# Patient Record
Sex: Female | Born: 1940 | Race: White | Hispanic: No | Marital: Married
Health system: Southern US, Community
[De-identification: ages and names within clinical notes are randomized; demographics above are authoritative.]

---

## 2003-03-27 ENCOUNTER — Other Ambulatory Visit: Admission: RE | Admit: 2003-03-27 | Discharge: 2003-03-27 | Payer: Self-pay | Admitting: Family Medicine

## 2003-04-03 ENCOUNTER — Emergency Department (HOSPITAL_COMMUNITY): Admission: EM | Admit: 2003-04-03 | Discharge: 2003-04-03 | Payer: Self-pay | Admitting: Emergency Medicine

## 2003-05-01 ENCOUNTER — Encounter (INDEPENDENT_AMBULATORY_CARE_PROVIDER_SITE_OTHER): Payer: Self-pay | Admitting: *Deleted

## 2003-05-01 ENCOUNTER — Ambulatory Visit (HOSPITAL_COMMUNITY): Admission: RE | Admit: 2003-05-01 | Discharge: 2003-05-02 | Payer: Self-pay | Admitting: General Surgery

## 2004-04-22 ENCOUNTER — Other Ambulatory Visit: Admission: RE | Admit: 2004-04-22 | Discharge: 2004-04-22 | Payer: Self-pay | Admitting: Family Medicine

## 2005-05-14 ENCOUNTER — Other Ambulatory Visit: Admission: RE | Admit: 2005-05-14 | Discharge: 2005-05-14 | Payer: Self-pay | Admitting: Family Medicine

## 2007-05-29 ENCOUNTER — Other Ambulatory Visit: Admission: RE | Admit: 2007-05-29 | Discharge: 2007-05-29 | Payer: Self-pay | Admitting: Family Medicine

## 2007-08-02 ENCOUNTER — Encounter (INDEPENDENT_AMBULATORY_CARE_PROVIDER_SITE_OTHER): Payer: Self-pay | Admitting: Obstetrics and Gynecology

## 2007-08-02 ENCOUNTER — Ambulatory Visit (HOSPITAL_COMMUNITY): Admission: RE | Admit: 2007-08-02 | Discharge: 2007-08-03 | Payer: Self-pay | Admitting: Obstetrics and Gynecology

## 2010-06-02 NOTE — Op Note (Signed)
NAMECHERRYL, Julia [name]               ACCOUNT NO.:  [id number]   MEDICAL RECORD NO.:  [id number]          PATIENT TYPE:  OIB   LOCATION:  9304                          FACILITY:  [facility]   PHYSICIAN:  Charles A. Delcambre, MDDATE OF BIRTH:  1940-10-11   DATE OF PROCEDURE:  08/02/2007  DATE OF DISCHARGE:                               OPERATIVE REPORT   PREOPERATIVE DIAGNOSES:  1. Uterine prolapse.  2. Cystocele.   POSTOPERATIVE DIAGNOSES:  1. Uterine prolapse.  2. Cystocele.   PROCEDURE:  1. Transvaginal hysterectomy.  2. Bilateral salpingo-oophorectomy.  3. Anterior repair.   SURGEON:  Charles A. Sydnee Cabal, MD   ASSISTANT:  Gerald Leitz, MD   COMPLICATIONS:  None.   ESTIMATED BLOOD LOSS:  100 mL.   ANESTHESIA:  General via the endotracheal route.   SPECIMEN:  Uterus, tubes, and ovaries bilaterally to pathology.   FINDINGS:  Large cystocele and prolapse of the uterus to 1 cm out of the  introitus.   INSTRUMENT, SPONGE, AND NEEDLE COUNTS:  Correct x2.   DESCRIPTION OF PROCEDURE:  The patient was taken to the operating room  and placed in supine position.  General anesthetic was induced without  difficulty.  She was then placed in dorsal lithotomy position with  Universal stirrups.  Sterile prep and drape was undertaken.  Speculum  was placed in the vagina.  Lahey clamps were used to grasp the speculum  via the cervix.  A 1% lidocaine with 1:100,000 epinephrine was injected  circumferentially, 10 mL, to help develop the dissection planes.  Knife  was then used to score around the cervix.  Bladder pillars  were cut and  dissection was done sharply, part of the way up the lower uterine  segment to start the bladder flap plane.  Ray-Tec was then packed into  the space and spontaneous entry into the peritoneal cavity was noted  upon withdrawal of the Ray-Tec sponge.  Posterior colpotomy was done  without incident.  Bonnano retractor was placed.  Uterosacral ligaments  were then  taken, cut, and transfixed with 0-Vicryl, and held.  Second  pedicles were taken up on either side and transfixion stitched.  Third  pedicles did include the uterine vessels.  These were carefully tied  with good hemostasis resulting.  Two other pedicles were taken up either  side and transfixion stitched.  The uterus was then flipped posteriorly  and the utero-ovarian pedicles were cross-clamped and cut.  Uterus and  cervix was removed.  Free ties were placed on the utero-ovarian pedicles  bilaterally with a transfixion stitch of 0-Vicryl.  Hemostasis was good  on the left.  There was some bleeding on the right.  Getting better  exposure, ovary was grasped with Babcock forceps.  Tube was retracted in  and bleeding could be seen coming from just beneath the ovary.  This  area was cross-clamped and the ovary was excised with a fallopian tube.  Free tie and then transfixion stitch of 0-Vicryl was placed.  It was  good.  One area of the peritoneal bleeding was held with right angle 2-0  Vicryl and  a transfixion stitch around this area.  Good hemostasis  resulted.  The peritoneum was isolated and a 2-0 Vicryl purse-string  stitch was placed.  She was then noted to have good pedicles.  Sutures  were cut and the cuff was run with 0-Vicryl for hemostasis while the  anterior repair was done.  Allis clamps were placed.  Metzenbaum  scissors were used to develop the plane dropping the bladder off the  anterior vaginal mucosa.  These were successfully held with Allis clamps  and dissection was taken up to about 1-1.5 cm from the urethral opening  encompassing the cystocele completely.  Some sharp dissection with the  Metzenbaum scissors and blunt dissection was used to take the bladder  down off the bladder flaps on either side.  Endopelvic fascia was then  plicated in a total of 4 sutures of 2-0 Vicryl giving excellent support  to the bladder.  Hemostasis was excellent.  Excessive vaginal mucosa  was  then excised and the cuff Richardson angle sutures were placed with 0-  Vicryl.  Cuff was then run and locked along with the vaginal mucosa over  the cystocele with 2-0 Vicryl over the cystocele dissection and 0-Vicryl  over the cuff.  Hemostasis was excellent.  Foley catheter was placed in  the bladder and the Estrace 1 x 1-inch pack was then placed in the  vagina.  The patient was taken to recovery with physician in attendance  having tolerated the procedure well.      Charles A. Sydnee Cabal, MD  Electronically Signed     CAD/MEDQ  D:  08/02/2007  T:  08/02/2007  Job:  [id number]

## 2010-06-05 NOTE — Op Note (Signed)
NAME:  Julia [name], Julia [name]                         ACCOUNT NO.:  [id number]   MEDICAL RECORD NO.:  [id number]                   PATIENT TYPE:  OIB   LOCATION:  5725                                 FACILITY:  [facility]   PHYSICIAN:  Ollen Gross. Vernell Morgans, M.D.              DATE OF BIRTH:  05-27-1940   DATE OF PROCEDURE:  05/01/2003  DATE OF DISCHARGE:  05/02/2003                                 OPERATIVE REPORT   PREOPERATIVE DIAGNOSES:  Gallstones.   POSTOPERATIVE DIAGNOSES:  Gallstones.   PROCEDURE:  Laparoscopic cholecystectomy with intraoperative cholangiogram.   SURGEON:  Ollen Gross. Carolynne Edouard, M.D.   ASSISTANT:  Currie Paris, M.D.   ANESTHESIA:  General endotracheal.   DESCRIPTION OF PROCEDURE:  After informed consent was obtained, the patient  was brought to the operating room, placed in supine position on the  operating table.  After adequate induction of general endotracheal  anesthesia, the patient's abdomen was prepped with Betadine and draped in  the usual sterile manner.  The area below the umbilicus was infiltrated with  0.25% Marcaine, a small incision was made with a 15 blade knife, this  incision was carried down through the subcutaneous tissue bluntly with a  Kelly clamp and [employer] retractors until the linea alba was identified.  The linea alba was incised with a 15 blade knife and each side was grasped  with Kocher clamps and elevated anteriorly.  The preperitoneal space was  probed bluntly with a hemostat until the peritoneum was opened and access  was gained to the abdominal cavity. A #0 Vicryl pursestring suture was  placed in the fascia surrounding the opening, Hasson cannula was placed  through the opening and anchored in place with the previously placed Vicryl  pursestring stitch. The abdomen was then insufflated with carbon dioxide  without difficulty. The patient was placed in the headup position, the  laparoscope was placed through the Hasson cannula, the right  upper quadrant  was inspected. The dome of the gallbladder and liver were readily  identified. The epigastric region was then infiltrated with 0.25% Marcaine.  A small incision was made with a 15 blade knife and a 10 mm port was placed  bluntly through this incision into the abdominal cavity under direct vision.  Sites were then chosen for the 5 mm ports on the right side of the abdomen  laterally.  These areas were infiltrated with 0.25% Marcaine, small stab  incisions were made with a 15 blade knife and 5 mm ports were placed bluntly  through these incisions into the abdominal cavity under direct vision.  A  blunt grasper was placed through the lateral most 5 mm port and used to  grasp the dome of the gallbladder and elevate it anteriorly and superiorly.  Another blunt grasper was placed through the other 5 mm port and used to  retract on the body and neck of the gallbladder. A dissector  was placed  through the epigastric port and using the electrocautery the peritoneal  reflection at the gallbladder neck was opened. Blunt dissection was then  carried out in this area until the gallbladder neck cystic duct junction was  readily identified and a good window was created. A single clip was placed  on the gallbladder neck, a small ductotomy was made just below the clip. A  14 gauge angiocath was then placed percutaneously through the anterior  abdominal wall under direct vision. A Reddick cholangiogram catheter was  then placed through the angiocath and flushed. The Reddick catheter was  placed within the cystic duct and anchored in place with a clip.  A  cholangiogram was obtained that showed no filling defects, good emptying  into the duodenum and adequate length on the cystic duct. The anchoring clip  and catheter was then removed from the patient and three clips were placed  proximally on the cystic duct and the duct was divided between the two sets  of clips. Posterior to this, the  cystic artery was identified and gain  dissected bluntly in a circumferential manner until a good window was  created. Two clips were placed proximally and one distally on the artery and  the artery was divided between the two. Next, the laparoscopic hook cautery  device was used to separate the gallbladder from the liver bed prior to  completely detaching the gallbladder from the liver bed. The liver bed was  inspected and several small bleeding points were coagulated with the  electrocautery. The gallbladder was then detached the rest of the way from  the liver bed without with a hook electrocautery.  The endoscopic bag was  placed through the epigastric port and the gallbladder was placed within the  bag and the bag was sealed. The laparoscope was then moved to the epigastric  port and the gallbladder grasper was placed through the Hasson cannula, used  to grasp the opening of the bag. The bag with the gallbladder was then  removed through the infraumbilical port without difficulty.  The fascial  defects from the epigastric port and the infraumbilical port were closed  with interrupted Prolene stitches. The skin incisions were all closed with  interrupted 4-0 Monocryl subcuticular stitches, Benzoin and Steri-Strips and  sterile dressings were applied.  The patient tolerated the procedure well.  At the end of the case, all sponge, needle and instrument counts were  correct. The patient was then awakened and taken to the recovery room in  stable condition.                                               Ollen Gross. Vernell Morgans, M.D.    PST/MEDQ  D:  05/08/2003  T:  05/08/2003  Job:  [id number]

## 2010-06-05 NOTE — Consult Note (Signed)
NAME:  Julia [name], Julia [name]                         ACCOUNT NO.:  [id number]   MEDICAL RECORD NO.:  [id number]                   PATIENT TYPE:  EMS   LOCATION:  ED                                   FACILITY:  [facility]   PHYSICIAN:  Ollen Gross. Vernell Morgans, M.D.              DATE OF BIRTH:  26-Nov-1940   DATE OF CONSULTATION:  04/03/2003  DATE OF DISCHARGE:                                   CONSULTATION   REASON FOR CONSULTATION:  Julia [name] is a 70 year old white female who woke  up today earlier with severe epigastric and right upper quadrant pain that  radiated to her back.  It was not really associated with any nausea or  vomiting.  She did not have any fevers or chills.  She has not had any chest  pain, shortness of breath, diarrhea, dysuria.  The pain lasted until she  went to her medical doctors and got a pain shot and at that point the pain  completely went away and she feels good and has no complaints right now.  She was sent to the emergency department for further evaluation and  ultrasound examination.  The rest of her review of systems is unremarkable.   PAST MEDICAL HISTORY:  1. Hypertension  2. Anxiety.   PAST SURGICAL HISTORY:  1. Tubal ligation.   MEDICATIONS:  1. Hydrochlorothiazide  2. Zoloft  3. Vitamins  4. Hormone replacement.   ALLERGIES:  NO KNOWN DRUG ALLERGIES   SOCIAL HISTORY:  She denies any alcohol or tobacco products.   FAMILY HISTORY:  Noncontributory.   PHYSICAL EXAMINATION:  VITAL SIGNS:  Temperature 97.8, blood pressure  148/79, pulse is 67.  GENERAL:  She is a well-developed, well-nourished white female in no acute  distress.  SKIN:  Warm and dry with no jaundice.  HEENT:  Eyes:  Her extraocular muscles are intact.  Pupils equal, round,  reactive to light.  Sclerae nonicteric.  LUNGS:  Clear bilaterally with no use of accessory respiratory muscles.  HEART:  Regular rate and rhythm with an impulse in the left chest.  ABDOMEN:  Soft and nontender with  no palpable mass or hepatosplenomegaly.  EXTREMITIES:  No clubbing, cyanosis, or edema.  PSYCHOLOGIC:  She is alert and oriented x3 with no evidence of anxiety or  depression.   LABORATORY DATA:  On her review of her ultrasound, she did have a couple of  small stones in her gallbladder but no gallbladder wall thickening or ductal  dilatation.  Her liver functions were normal.  White count was normal.   ASSESSMENT AND PLAN:  This is a 70 year old white female who had a what  sounds like a gallbladder attack earlier this afternoon but she is  completely pain-free now and would like to go home from the emergency  department.  I think that is reasonable but I have discussed with her the  possibility of having her gallbladder  removed and the likelihood that she  could potentially have more painful episodes in the future because of this  and she is agreeable to this.  We will plan to schedule her gallbladder  surgery for her on an elective basis and we will contact her in the next day  to make sure she is okay.  She has our numbers and agrees to call us right  away if she has any recurrence of her symptoms.                                               Ollen Gross. Vernell Morgans, M.D.    PST/MEDQ  D:  04/03/2003  T:  04/05/2003  Job:  [id number]

## 2010-06-05 NOTE — H&P (Signed)
Julia [name], [name]               ACCOUNT NO.:  [id number]   MEDICAL RECORD NO.:  [id number]          PATIENT TYPE:  AMB   LOCATION:  SDC                           FACILITY:  [facility]   PHYSICIAN:  Charles A. Delcambre, MDDATE OF BIRTH:  [birth year]   DATE OF ADMISSION:  DATE OF DISCHARGE:                              HISTORY & PHYSICAL   CHIEF COMPLAINT:  Pelvic prolapse and pain and cystocele.   HISTORY OF PRESENT ILLNESS:  She is a 70 year old gravida 2, para 2-0-0-  2 with deliveries 7-1/2-pound babies without problem.  She was  originally referred from [place] at [facility], Dr Daphane Shepherd.  She presents  with 8-month history.  When she sits up something comes out of the  vagina and she feels a popping sensation.  She can feel protruding  throughout the day and it causes pelvic pressure, irritation, some back  discomfort mainly.  She is postmenopausal without any bleeding.  She  does have frequency of urination.  She did take Detrol for 1 month and  noted some improvement, but not greatly so.  Urinalysis had been done,  and was negative per the patient's history.  She sometimes has to go and  urinate every 30 minutes.  She has no urge incontinence episodes or  significant episodes of nocturia.  She denies stress urinary  incontinence.   PAST MEDICAL HISTORY:  1. Hypertension.  2. Anxiety.   SURGICAL HISTORY:  Cholecystectomy.   MEDICATIONS:  1. Hydrochlorothiazide 12.5 mg once a day.  2. Zoloft 50 mg once a day.  3. Baby aspirin once a day, stopped 1 week ago.   ALLERGIES:  No known drug allergies.   SOCIAL HISTORY:  No tobacco, ethanol, or drug use.  The patient is  married, but not sexually active secondary to age.   FAMILY HISTORY:  Denies family history of breast, uterus, ovary, colon  cancer, cervical cancer, or lymphoma, coronary artery disease, stroke,  diabetes, and hypertension.   REVIEW OF SYSTEMS:  She has some problems with her bladder and  occasional night sweats  and hot flashes.  Denies fevers or chills, no  rashes or lesions, headaches, dizziness, seasonal allergies, chest pain,  shortness of breath, wheezing, diarrhea, constipation, bleeding, melena,  hematochezia, urgency is present, but no frequency, dysuria,  incontinence, no hematuria, no galactorrhea, no emotional changes.   PHYSICAL EXAMINATION:  GENERAL:  Alert and oriented x3, in no distress.  General appearance good.  Mood and affect normal.  Development normal.  Nutrition normal.  Grooming normal.  No deformities.  VITAL SIGNS:  Blood pressure is 140/80, respirations 16, and pulse 60.  She is afebrile.  CORONARY:  Regular rate and rhythm without murmur that I can detect, rub  or gallop.  LUNGS:  Clear bilaterally.  ABDOMEN:  Soft, flat, and nontender.  No hepatosplenomegaly or other  masses noted.  PELVIC:  Normal external female genitalia.  Bartholin, urethra, Skene's  within normal limits.  Vulva without discharge or lesions.  There is a  bulge to the vaginal introitus, but not protruding with Valsalva.  This  markedly protrudes out  anteriorly and the cervix comes down to the  introitus.  There is no evidence of rectal protrusion or rectocele  grossly other than, by digital rectal exam, a small rectocele.   ASSESSMENT:  1. Cystocele.  2. Uterine prolapse.  3. Urinary urgency.   PLAN:  Discussed surgical management.  She wished to proceed.  We will  proceed with transvaginal hysterectomy, bilateral salpingo-oophorectomy  if technically possible.  Also, we will proceed with anterior and  posterior repair as needed, possibly not working on the rectocele, but  mainly the large cystocele.  She gives informed consent.  Risks of  infection, bleeding, bowel and bladder damage, blood product risk  including hepatitis and HIV exposure, ureteral damage, failed treatment,  redevelopment of prolapse or cystocele, indwelling catheter, DVT.  All  questions were answered.  She gives  informed consent.  She will remain  n.p.o. past midnight or 6-8 hours prior surgery whichever more is  appropriate.  Antibiotic prophylaxis with Cefotan 2 g will be given  prior to the OR.  We will proceed as outlined.      Charles A. Sydnee Cabal, MD  Electronically Signed     CAD/MEDQ  D:  07/05/2007  T:  07/06/2007  Job:  [id number]

## 2010-10-15 LAB — COMPREHENSIVE METABOLIC PANEL
ALT: 46 — ABNORMAL HIGH
Alkaline Phosphatase: 74
CO2: 31
Glucose, Bld: 100 — ABNORMAL HIGH
Potassium: 3.5
Sodium: 140
Total Protein: 7.1

## 2010-10-15 LAB — CBC
Hemoglobin: 15.1 — ABNORMAL HIGH
RBC: 4.72
RDW: 13.7
WBC: 6.3

## 2010-10-16 LAB — CBC
MCHC: 33.7
MCV: 94.8
Platelets: 263
WBC: 10.5

## 2011-11-29 ENCOUNTER — Other Ambulatory Visit: Payer: Self-pay | Admitting: Family Medicine

## 2013-10-24 ENCOUNTER — Other Ambulatory Visit: Payer: Self-pay | Admitting: Family Medicine

## 2013-10-24 DIAGNOSIS — N644 Mastodynia: Secondary | ICD-10-CM

## 2013-11-02 ENCOUNTER — Inpatient Hospital Stay: Admission: RE | Admit: 2013-11-02 | Payer: Self-pay | Source: Ambulatory Visit

## 2013-11-02 ENCOUNTER — Other Ambulatory Visit: Payer: Self-pay

## 2019-02-08 ENCOUNTER — Ambulatory Visit: Payer: Medicare Other

## 2019-04-12 ENCOUNTER — Other Ambulatory Visit: Payer: Self-pay

## 2019-04-12 ENCOUNTER — Ambulatory Visit (INDEPENDENT_AMBULATORY_CARE_PROVIDER_SITE_OTHER): Payer: Medicare Other

## 2019-04-12 ENCOUNTER — Other Ambulatory Visit: Payer: Self-pay | Admitting: Physician Assistant

## 2019-04-12 DIAGNOSIS — R2231 Localized swelling, mass and lump, right upper limb: Secondary | ICD-10-CM

## 2020-04-03 DIAGNOSIS — C50011 Malignant neoplasm of nipple and areola, right female breast: Secondary | ICD-10-CM | POA: Diagnosis not present

## 2020-04-03 DIAGNOSIS — Z170 Estrogen receptor positive status [ER+]: Secondary | ICD-10-CM | POA: Diagnosis not present

## 2020-04-03 DIAGNOSIS — E559 Vitamin D deficiency, unspecified: Secondary | ICD-10-CM | POA: Diagnosis not present

## 2020-04-03 DIAGNOSIS — Z1231 Encounter for screening mammogram for malignant neoplasm of breast: Secondary | ICD-10-CM | POA: Diagnosis not present

## 2020-05-13 DIAGNOSIS — M1712 Unilateral primary osteoarthritis, left knee: Secondary | ICD-10-CM | POA: Diagnosis not present

## 2020-08-25 DIAGNOSIS — C50011 Malignant neoplasm of nipple and areola, right female breast: Secondary | ICD-10-CM | POA: Diagnosis not present

## 2020-08-25 DIAGNOSIS — Z170 Estrogen receptor positive status [ER+]: Secondary | ICD-10-CM | POA: Diagnosis not present

## 2020-08-25 DIAGNOSIS — Z1231 Encounter for screening mammogram for malignant neoplasm of breast: Secondary | ICD-10-CM | POA: Diagnosis not present

## 2020-09-17 DIAGNOSIS — Z170 Estrogen receptor positive status [ER+]: Secondary | ICD-10-CM | POA: Diagnosis not present

## 2020-09-17 DIAGNOSIS — C50011 Malignant neoplasm of nipple and areola, right female breast: Secondary | ICD-10-CM | POA: Diagnosis not present

## 2020-09-17 DIAGNOSIS — Z79811 Long term (current) use of aromatase inhibitors: Secondary | ICD-10-CM | POA: Diagnosis not present

## 2020-09-17 DIAGNOSIS — Z5181 Encounter for therapeutic drug level monitoring: Secondary | ICD-10-CM | POA: Diagnosis not present

## 2020-09-17 DIAGNOSIS — E559 Vitamin D deficiency, unspecified: Secondary | ICD-10-CM | POA: Diagnosis not present

## 2020-11-06 DIAGNOSIS — Z136 Encounter for screening for cardiovascular disorders: Secondary | ICD-10-CM | POA: Diagnosis not present

## 2020-11-06 DIAGNOSIS — Z131 Encounter for screening for diabetes mellitus: Secondary | ICD-10-CM | POA: Diagnosis not present

## 2020-11-06 DIAGNOSIS — I10 Essential (primary) hypertension: Secondary | ICD-10-CM | POA: Diagnosis not present

## 2020-11-06 DIAGNOSIS — Z0000 Encounter for general adult medical examination without abnormal findings: Secondary | ICD-10-CM | POA: Diagnosis not present

## 2020-11-07 DIAGNOSIS — C50011 Malignant neoplasm of nipple and areola, right female breast: Secondary | ICD-10-CM | POA: Diagnosis not present

## 2020-11-07 DIAGNOSIS — Z170 Estrogen receptor positive status [ER+]: Secondary | ICD-10-CM | POA: Diagnosis not present

## 2020-11-07 DIAGNOSIS — Z5181 Encounter for therapeutic drug level monitoring: Secondary | ICD-10-CM | POA: Diagnosis not present

## 2020-11-07 DIAGNOSIS — Z79811 Long term (current) use of aromatase inhibitors: Secondary | ICD-10-CM | POA: Diagnosis not present

## 2021-01-01 DIAGNOSIS — S0501XA Injury of conjunctiva and corneal abrasion without foreign body, right eye, initial encounter: Secondary | ICD-10-CM | POA: Diagnosis not present

## 2021-01-02 DIAGNOSIS — H109 Unspecified conjunctivitis: Secondary | ICD-10-CM | POA: Diagnosis not present

## 2021-01-14 DIAGNOSIS — H527 Unspecified disorder of refraction: Secondary | ICD-10-CM | POA: Diagnosis not present

## 2021-01-14 DIAGNOSIS — H43813 Vitreous degeneration, bilateral: Secondary | ICD-10-CM | POA: Diagnosis not present

## 2021-01-14 DIAGNOSIS — H5052 Exophoria: Secondary | ICD-10-CM | POA: Diagnosis not present

## 2021-01-14 DIAGNOSIS — H02834 Dermatochalasis of left upper eyelid: Secondary | ICD-10-CM | POA: Diagnosis not present

## 2021-01-14 DIAGNOSIS — H35033 Hypertensive retinopathy, bilateral: Secondary | ICD-10-CM | POA: Diagnosis not present

## 2021-01-14 DIAGNOSIS — H52203 Unspecified astigmatism, bilateral: Secondary | ICD-10-CM | POA: Diagnosis not present

## 2021-01-14 DIAGNOSIS — H25813 Combined forms of age-related cataract, bilateral: Secondary | ICD-10-CM | POA: Diagnosis not present

## 2021-01-14 DIAGNOSIS — H02831 Dermatochalasis of right upper eyelid: Secondary | ICD-10-CM | POA: Diagnosis not present

## 2021-01-20 DIAGNOSIS — C50011 Malignant neoplasm of nipple and areola, right female breast: Secondary | ICD-10-CM | POA: Diagnosis not present

## 2021-01-20 DIAGNOSIS — Z1231 Encounter for screening mammogram for malignant neoplasm of breast: Secondary | ICD-10-CM | POA: Diagnosis not present

## 2021-01-20 DIAGNOSIS — Z170 Estrogen receptor positive status [ER+]: Secondary | ICD-10-CM | POA: Diagnosis not present

## 2021-02-03 DIAGNOSIS — H52203 Unspecified astigmatism, bilateral: Secondary | ICD-10-CM | POA: Diagnosis not present

## 2021-02-03 DIAGNOSIS — H25813 Combined forms of age-related cataract, bilateral: Secondary | ICD-10-CM | POA: Diagnosis not present

## 2021-02-10 DIAGNOSIS — Z9049 Acquired absence of other specified parts of digestive tract: Secondary | ICD-10-CM | POA: Diagnosis not present

## 2021-02-10 DIAGNOSIS — H52223 Regular astigmatism, bilateral: Secondary | ICD-10-CM | POA: Diagnosis not present

## 2021-02-10 DIAGNOSIS — H02834 Dermatochalasis of left upper eyelid: Secondary | ICD-10-CM | POA: Diagnosis not present

## 2021-02-10 DIAGNOSIS — H43813 Vitreous degeneration, bilateral: Secondary | ICD-10-CM | POA: Diagnosis not present

## 2021-02-10 DIAGNOSIS — H25811 Combined forms of age-related cataract, right eye: Secondary | ICD-10-CM | POA: Diagnosis not present

## 2021-02-10 DIAGNOSIS — Z87891 Personal history of nicotine dependence: Secondary | ICD-10-CM | POA: Diagnosis not present

## 2021-02-10 DIAGNOSIS — H02831 Dermatochalasis of right upper eyelid: Secondary | ICD-10-CM | POA: Diagnosis not present

## 2021-02-10 DIAGNOSIS — Z7982 Long term (current) use of aspirin: Secondary | ICD-10-CM | POA: Diagnosis not present

## 2021-02-10 DIAGNOSIS — I10 Essential (primary) hypertension: Secondary | ICD-10-CM | POA: Diagnosis not present

## 2021-02-10 DIAGNOSIS — H25813 Combined forms of age-related cataract, bilateral: Secondary | ICD-10-CM | POA: Diagnosis not present

## 2021-02-17 DIAGNOSIS — Z7982 Long term (current) use of aspirin: Secondary | ICD-10-CM | POA: Diagnosis not present

## 2021-02-17 DIAGNOSIS — H25812 Combined forms of age-related cataract, left eye: Secondary | ICD-10-CM | POA: Diagnosis not present

## 2021-02-17 DIAGNOSIS — H43813 Vitreous degeneration, bilateral: Secondary | ICD-10-CM | POA: Diagnosis not present

## 2021-02-17 DIAGNOSIS — Z87891 Personal history of nicotine dependence: Secondary | ICD-10-CM | POA: Diagnosis not present

## 2021-02-17 DIAGNOSIS — I10 Essential (primary) hypertension: Secondary | ICD-10-CM | POA: Diagnosis not present

## 2021-02-17 DIAGNOSIS — H52223 Regular astigmatism, bilateral: Secondary | ICD-10-CM | POA: Diagnosis not present

## 2021-02-17 DIAGNOSIS — H02834 Dermatochalasis of left upper eyelid: Secondary | ICD-10-CM | POA: Diagnosis not present

## 2021-02-17 DIAGNOSIS — H02831 Dermatochalasis of right upper eyelid: Secondary | ICD-10-CM | POA: Diagnosis not present

## 2021-02-17 DIAGNOSIS — Z9049 Acquired absence of other specified parts of digestive tract: Secondary | ICD-10-CM | POA: Diagnosis not present

## 2021-02-17 DIAGNOSIS — H52222 Regular astigmatism, left eye: Secondary | ICD-10-CM | POA: Diagnosis not present

## 2021-02-17 DIAGNOSIS — H25813 Combined forms of age-related cataract, bilateral: Secondary | ICD-10-CM | POA: Diagnosis not present

## 2021-02-17 DIAGNOSIS — Z79899 Other long term (current) drug therapy: Secondary | ICD-10-CM | POA: Diagnosis not present

## 2021-05-20 DIAGNOSIS — Z1231 Encounter for screening mammogram for malignant neoplasm of breast: Secondary | ICD-10-CM | POA: Diagnosis not present

## 2021-05-20 DIAGNOSIS — Z170 Estrogen receptor positive status [ER+]: Secondary | ICD-10-CM | POA: Diagnosis not present

## 2021-05-20 DIAGNOSIS — C50011 Malignant neoplasm of nipple and areola, right female breast: Secondary | ICD-10-CM | POA: Diagnosis not present

## 2021-08-19 IMAGING — US US EXTREM UP *R* LTD
1 series · 14 of 15 positions shown · non-contrast
Comparison: None.

CLINICAL DATA: Mass of right upper extremity

EXAM:
ULTRASOUND RIGHT UPPER EXTREMITY LIMITED
TECHNIQUE: Ultrasound examination of the upper extremity soft tissues was
performed in the area of clinical concern.

[Series 1: us extrem up *right* ltd · 0.05mm/px · 14 of 15 slices shown]
[im 1/15]
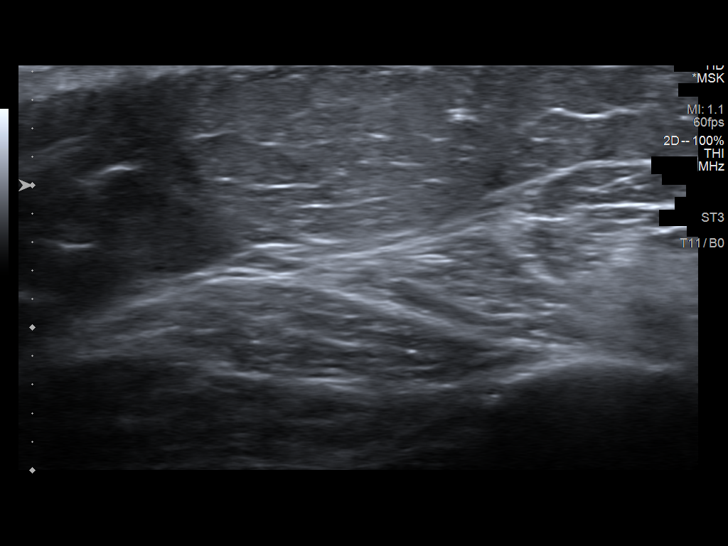
[im 2/15]
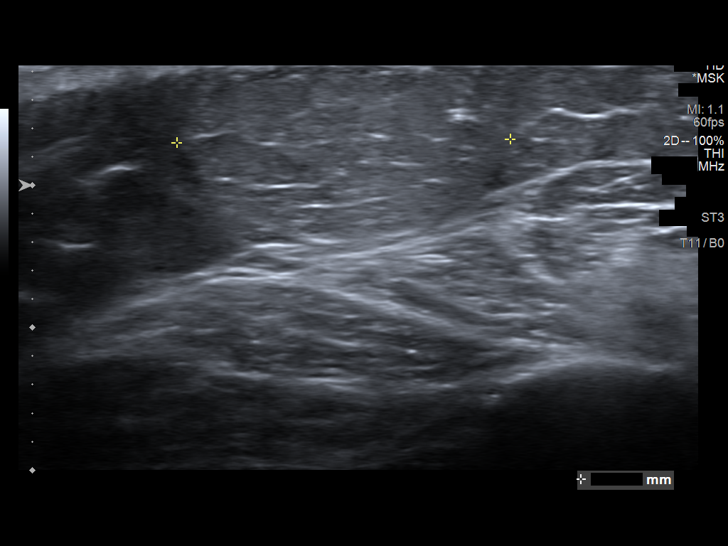
[im 3/15]
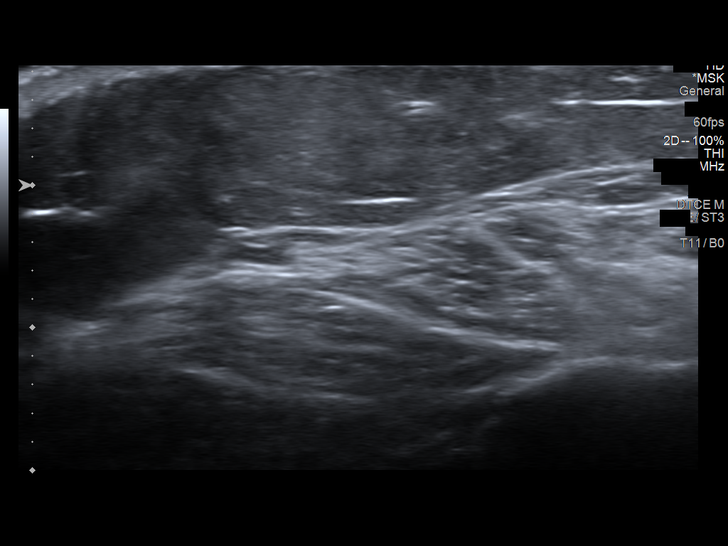
[im 4/15]
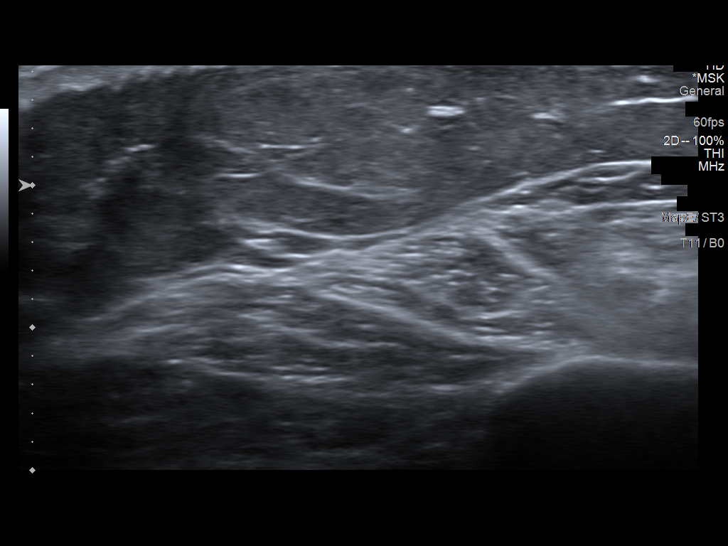
[im 5/15]
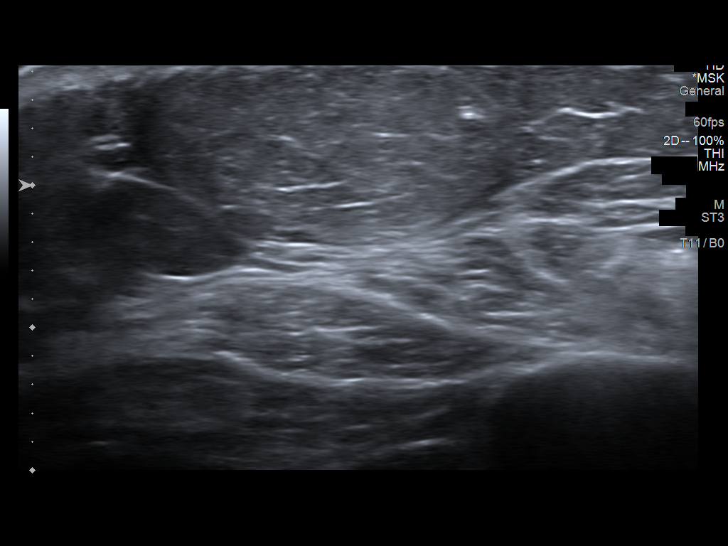
[im 6/15]
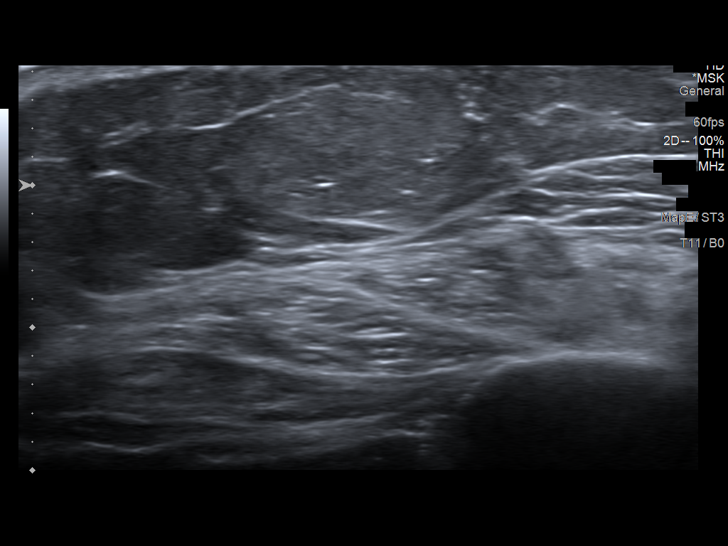
[im 7/15]
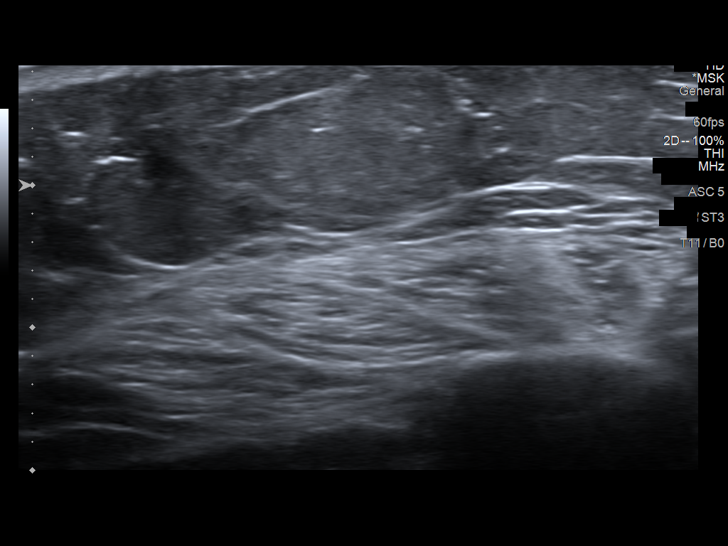
[im 9/15]
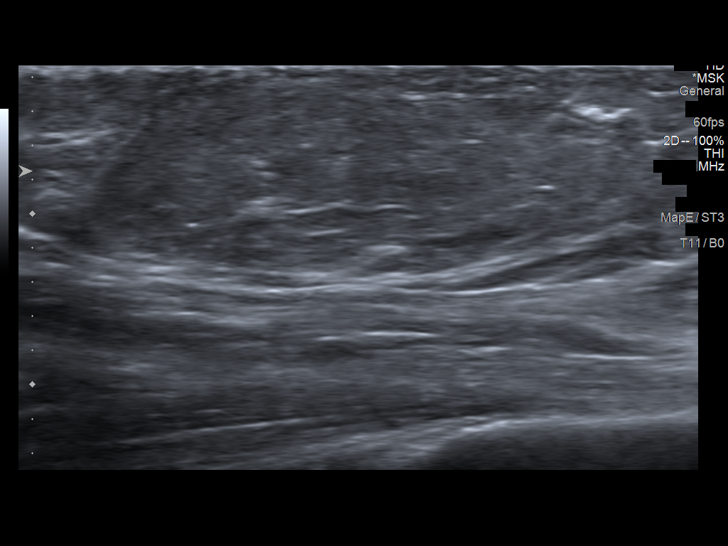
[im 10/15]
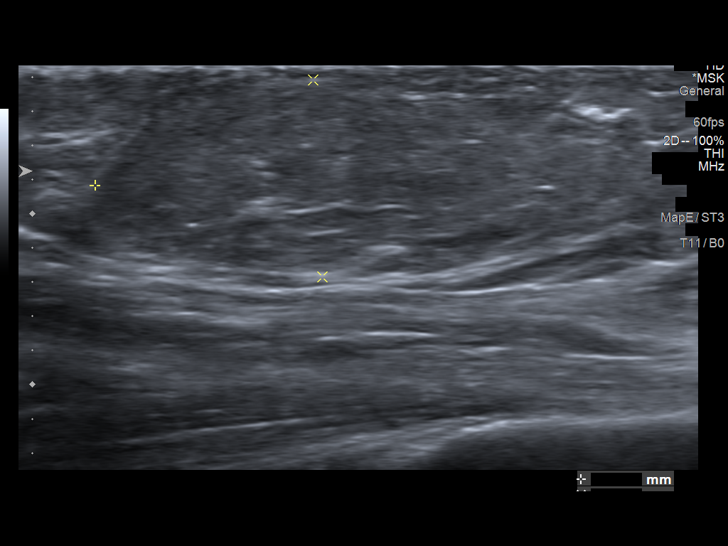
[im 11/15]
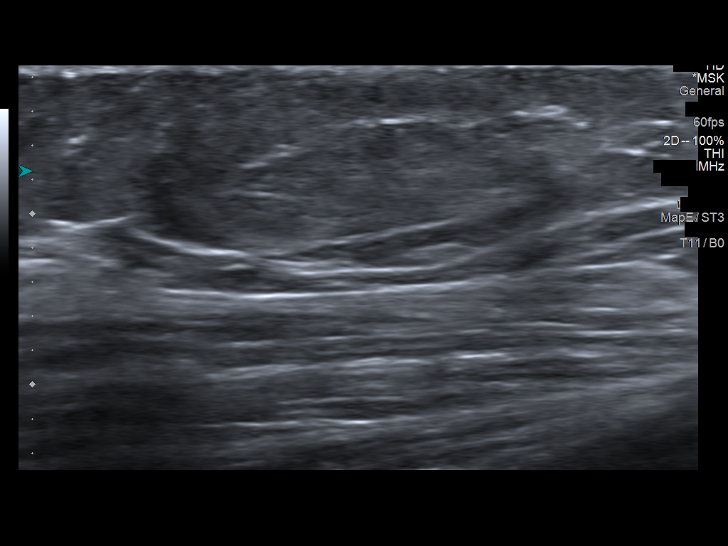
[im 12/15]
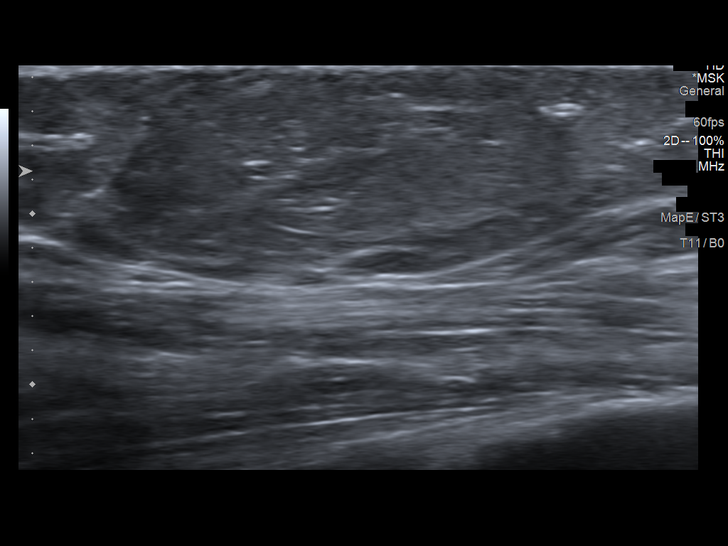
[im 13/15]
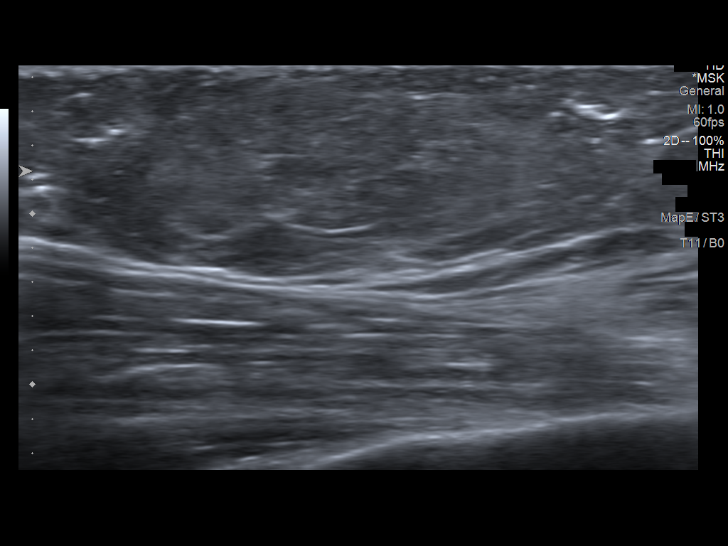
[im 14/15]
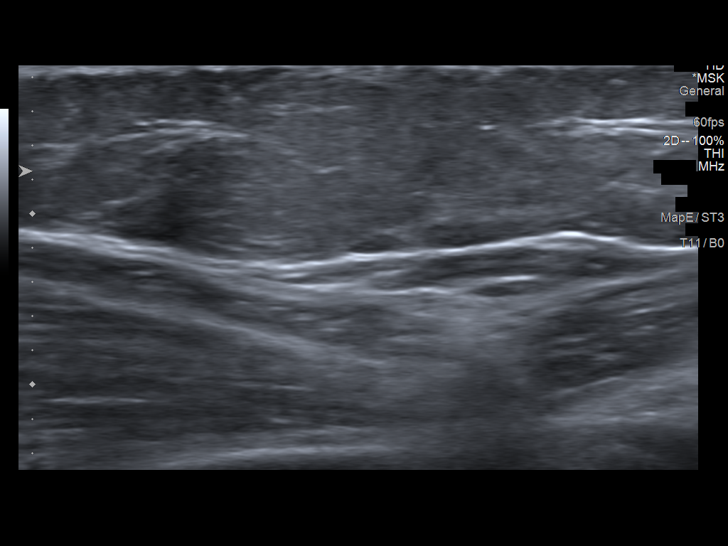
[im 15/15]
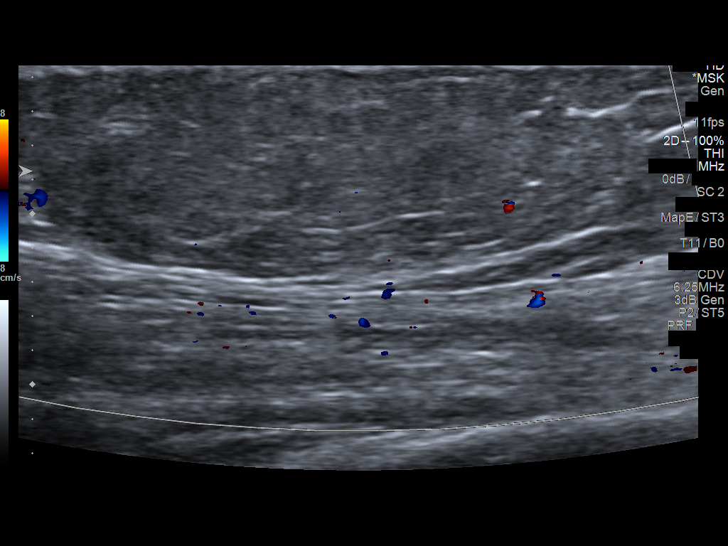

[14 of 15 positions shown; findings below may reference images not displayed]

FINDINGS: 3.4 x 1.2 x 2.3 cm mildly echogenic well-circumscribed mass in the
upper lateral right arm without significant internal Doppler flow.
Mass is nearly isoechoic to the adjacent subcutaneous fat. No
posterior acoustic shadowing or enhancement.

No other soft tissue mass or fluid collection.
IMPRESSION: 3.4 x 1.2 x 2.3 cm soft tissue mass in the subcutaneous fat of the
right upper lateral arm most consistent with a lipoma.

## 2021-08-31 DIAGNOSIS — C50011 Malignant neoplasm of nipple and areola, right female breast: Secondary | ICD-10-CM | POA: Diagnosis not present

## 2021-08-31 DIAGNOSIS — Z170 Estrogen receptor positive status [ER+]: Secondary | ICD-10-CM | POA: Diagnosis not present

## 2021-08-31 DIAGNOSIS — Z1231 Encounter for screening mammogram for malignant neoplasm of breast: Secondary | ICD-10-CM | POA: Diagnosis not present

## 2021-11-10 DIAGNOSIS — Z1211 Encounter for screening for malignant neoplasm of colon: Secondary | ICD-10-CM | POA: Diagnosis not present

## 2021-11-10 DIAGNOSIS — I10 Essential (primary) hypertension: Secondary | ICD-10-CM | POA: Diagnosis not present

## 2021-11-10 DIAGNOSIS — Z1231 Encounter for screening mammogram for malignant neoplasm of breast: Secondary | ICD-10-CM | POA: Diagnosis not present

## 2021-11-10 DIAGNOSIS — Z13820 Encounter for screening for osteoporosis: Secondary | ICD-10-CM | POA: Diagnosis not present

## 2021-11-10 DIAGNOSIS — Z0000 Encounter for general adult medical examination without abnormal findings: Secondary | ICD-10-CM | POA: Diagnosis not present

## 2021-11-10 DIAGNOSIS — Z136 Encounter for screening for cardiovascular disorders: Secondary | ICD-10-CM | POA: Diagnosis not present

## 2021-12-07 DIAGNOSIS — C50011 Malignant neoplasm of nipple and areola, right female breast: Secondary | ICD-10-CM | POA: Diagnosis not present

## 2021-12-07 DIAGNOSIS — Z170 Estrogen receptor positive status [ER+]: Secondary | ICD-10-CM | POA: Diagnosis not present

## 2021-12-07 DIAGNOSIS — E559 Vitamin D deficiency, unspecified: Secondary | ICD-10-CM | POA: Diagnosis not present

## 2021-12-28 DIAGNOSIS — R051 Acute cough: Secondary | ICD-10-CM | POA: Diagnosis not present

## 2021-12-28 DIAGNOSIS — U071 COVID-19: Secondary | ICD-10-CM | POA: Diagnosis not present

## 2022-03-18 DIAGNOSIS — G8929 Other chronic pain: Secondary | ICD-10-CM | POA: Diagnosis not present

## 2022-03-18 DIAGNOSIS — M1712 Unilateral primary osteoarthritis, left knee: Secondary | ICD-10-CM | POA: Diagnosis not present

## 2022-03-18 DIAGNOSIS — M25562 Pain in left knee: Secondary | ICD-10-CM | POA: Diagnosis not present

## 2022-06-07 DIAGNOSIS — C50011 Malignant neoplasm of nipple and areola, right female breast: Secondary | ICD-10-CM | POA: Diagnosis not present

## 2022-06-07 DIAGNOSIS — Z1231 Encounter for screening mammogram for malignant neoplasm of breast: Secondary | ICD-10-CM | POA: Diagnosis not present

## 2022-06-07 DIAGNOSIS — E559 Vitamin D deficiency, unspecified: Secondary | ICD-10-CM | POA: Diagnosis not present

## 2022-06-07 DIAGNOSIS — Z170 Estrogen receptor positive status [ER+]: Secondary | ICD-10-CM | POA: Diagnosis not present

## 2022-06-07 DIAGNOSIS — Z5181 Encounter for therapeutic drug level monitoring: Secondary | ICD-10-CM | POA: Diagnosis not present

## 2022-06-07 DIAGNOSIS — Z79811 Long term (current) use of aromatase inhibitors: Secondary | ICD-10-CM | POA: Diagnosis not present

## 2022-06-30 DIAGNOSIS — S81812A Laceration without foreign body, left lower leg, initial encounter: Secondary | ICD-10-CM | POA: Diagnosis not present

## 2022-06-30 DIAGNOSIS — Z23 Encounter for immunization: Secondary | ICD-10-CM | POA: Diagnosis not present

## 2022-06-30 DIAGNOSIS — W19XXXA Unspecified fall, initial encounter: Secondary | ICD-10-CM | POA: Diagnosis not present

## 2022-07-06 DIAGNOSIS — L03116 Cellulitis of left lower limb: Secondary | ICD-10-CM | POA: Diagnosis not present

## 2022-07-07 DIAGNOSIS — M1712 Unilateral primary osteoarthritis, left knee: Secondary | ICD-10-CM | POA: Diagnosis not present

## 2022-08-23 DIAGNOSIS — F17210 Nicotine dependence, cigarettes, uncomplicated: Secondary | ICD-10-CM | POA: Diagnosis not present

## 2022-08-23 DIAGNOSIS — U071 COVID-19: Secondary | ICD-10-CM | POA: Diagnosis not present

## 2022-09-03 DIAGNOSIS — Z1231 Encounter for screening mammogram for malignant neoplasm of breast: Secondary | ICD-10-CM | POA: Diagnosis not present

## 2022-09-03 DIAGNOSIS — C50011 Malignant neoplasm of nipple and areola, right female breast: Secondary | ICD-10-CM | POA: Diagnosis not present

## 2022-09-03 DIAGNOSIS — Z170 Estrogen receptor positive status [ER+]: Secondary | ICD-10-CM | POA: Diagnosis not present

## 2022-09-03 DIAGNOSIS — Z5181 Encounter for therapeutic drug level monitoring: Secondary | ICD-10-CM | POA: Diagnosis not present

## 2022-09-03 DIAGNOSIS — R92322 Mammographic fibroglandular density, left breast: Secondary | ICD-10-CM | POA: Diagnosis not present

## 2022-09-03 DIAGNOSIS — Z79811 Long term (current) use of aromatase inhibitors: Secondary | ICD-10-CM | POA: Diagnosis not present

## 2022-10-07 DIAGNOSIS — M1712 Unilateral primary osteoarthritis, left knee: Secondary | ICD-10-CM | POA: Diagnosis not present

## 2022-11-12 DIAGNOSIS — Z13220 Encounter for screening for lipoid disorders: Secondary | ICD-10-CM | POA: Diagnosis not present

## 2022-11-12 DIAGNOSIS — Z0000 Encounter for general adult medical examination without abnormal findings: Secondary | ICD-10-CM | POA: Diagnosis not present

## 2022-11-12 DIAGNOSIS — C50011 Malignant neoplasm of nipple and areola, right female breast: Secondary | ICD-10-CM | POA: Diagnosis not present

## 2022-11-12 DIAGNOSIS — Z23 Encounter for immunization: Secondary | ICD-10-CM | POA: Diagnosis not present

## 2022-11-12 DIAGNOSIS — Z136 Encounter for screening for cardiovascular disorders: Secondary | ICD-10-CM | POA: Diagnosis not present

## 2022-12-08 DIAGNOSIS — E559 Vitamin D deficiency, unspecified: Secondary | ICD-10-CM | POA: Diagnosis not present

## 2022-12-08 DIAGNOSIS — C50011 Malignant neoplasm of nipple and areola, right female breast: Secondary | ICD-10-CM | POA: Diagnosis not present

## 2022-12-08 DIAGNOSIS — Z170 Estrogen receptor positive status [ER+]: Secondary | ICD-10-CM | POA: Diagnosis not present

## 2023-03-14 DIAGNOSIS — M1712 Unilateral primary osteoarthritis, left knee: Secondary | ICD-10-CM | POA: Diagnosis not present

## 2023-03-31 DIAGNOSIS — M1712 Unilateral primary osteoarthritis, left knee: Secondary | ICD-10-CM | POA: Diagnosis not present

## 2023-04-11 DIAGNOSIS — M1712 Unilateral primary osteoarthritis, left knee: Secondary | ICD-10-CM | POA: Diagnosis not present

## 2023-04-25 DIAGNOSIS — M1711 Unilateral primary osteoarthritis, right knee: Secondary | ICD-10-CM | POA: Diagnosis not present

## 2023-04-25 DIAGNOSIS — M25561 Pain in right knee: Secondary | ICD-10-CM | POA: Diagnosis not present

## 2023-05-11 DIAGNOSIS — S82141A Displaced bicondylar fracture of right tibia, initial encounter for closed fracture: Secondary | ICD-10-CM | POA: Diagnosis not present

## 2023-05-12 DIAGNOSIS — M1711 Unilateral primary osteoarthritis, right knee: Secondary | ICD-10-CM | POA: Diagnosis not present

## 2023-05-12 DIAGNOSIS — Z87891 Personal history of nicotine dependence: Secondary | ICD-10-CM | POA: Diagnosis not present

## 2023-05-12 DIAGNOSIS — Z9989 Dependence on other enabling machines and devices: Secondary | ICD-10-CM | POA: Diagnosis not present

## 2023-05-12 DIAGNOSIS — M25561 Pain in right knee: Secondary | ICD-10-CM | POA: Diagnosis not present

## 2023-05-16 DIAGNOSIS — M25561 Pain in right knee: Secondary | ICD-10-CM | POA: Diagnosis not present

## 2023-05-16 DIAGNOSIS — M1711 Unilateral primary osteoarthritis, right knee: Secondary | ICD-10-CM | POA: Diagnosis not present

## 2023-05-25 DIAGNOSIS — M1712 Unilateral primary osteoarthritis, left knee: Secondary | ICD-10-CM | POA: Diagnosis not present

## 2023-05-28 DIAGNOSIS — S82131A Displaced fracture of medial condyle of right tibia, initial encounter for closed fracture: Secondary | ICD-10-CM | POA: Diagnosis not present

## 2023-05-28 DIAGNOSIS — S82141A Displaced bicondylar fracture of right tibia, initial encounter for closed fracture: Secondary | ICD-10-CM | POA: Diagnosis not present

## 2023-06-27 DIAGNOSIS — Z170 Estrogen receptor positive status [ER+]: Secondary | ICD-10-CM | POA: Diagnosis not present

## 2023-06-27 DIAGNOSIS — C50011 Malignant neoplasm of nipple and areola, right female breast: Secondary | ICD-10-CM | POA: Diagnosis not present

## 2023-06-27 DIAGNOSIS — E559 Vitamin D deficiency, unspecified: Secondary | ICD-10-CM | POA: Diagnosis not present

## 2023-07-14 DIAGNOSIS — M1712 Unilateral primary osteoarthritis, left knee: Secondary | ICD-10-CM | POA: Diagnosis not present

## 2023-07-14 DIAGNOSIS — S82141A Displaced bicondylar fracture of right tibia, initial encounter for closed fracture: Secondary | ICD-10-CM | POA: Diagnosis not present

## 2023-07-14 DIAGNOSIS — C50011 Malignant neoplasm of nipple and areola, right female breast: Secondary | ICD-10-CM | POA: Diagnosis not present

## 2023-07-14 DIAGNOSIS — Z170 Estrogen receptor positive status [ER+]: Secondary | ICD-10-CM | POA: Diagnosis not present

## 2023-08-08 DIAGNOSIS — M1711 Unilateral primary osteoarthritis, right knee: Secondary | ICD-10-CM | POA: Diagnosis not present

## 2023-09-05 DIAGNOSIS — Z1231 Encounter for screening mammogram for malignant neoplasm of breast: Secondary | ICD-10-CM | POA: Diagnosis not present

## 2023-09-05 DIAGNOSIS — C50011 Malignant neoplasm of nipple and areola, right female breast: Secondary | ICD-10-CM | POA: Diagnosis not present

## 2023-09-05 DIAGNOSIS — Z170 Estrogen receptor positive status [ER+]: Secondary | ICD-10-CM | POA: Diagnosis not present

## 2023-09-05 DIAGNOSIS — R92333 Mammographic heterogeneous density, bilateral breasts: Secondary | ICD-10-CM | POA: Diagnosis not present

## 2023-09-26 DIAGNOSIS — G8929 Other chronic pain: Secondary | ICD-10-CM | POA: Diagnosis not present

## 2023-09-26 DIAGNOSIS — M1712 Unilateral primary osteoarthritis, left knee: Secondary | ICD-10-CM | POA: Diagnosis not present

## 2023-09-26 DIAGNOSIS — M25562 Pain in left knee: Secondary | ICD-10-CM | POA: Diagnosis not present

## 2023-10-24 DIAGNOSIS — M170 Bilateral primary osteoarthritis of knee: Secondary | ICD-10-CM | POA: Diagnosis not present

## 2023-11-02 DIAGNOSIS — M170 Bilateral primary osteoarthritis of knee: Secondary | ICD-10-CM | POA: Diagnosis not present
